# Patient Record
Sex: Female | Born: 1980 | Hispanic: Yes | Marital: Married | Smoking: Never smoker
Health system: Southern US, Community
[De-identification: ages and names within clinical notes are randomized; demographics above are authoritative.]

## PROBLEM LIST (undated history)

## (undated) DIAGNOSIS — Z641 Problems related to multiparity: Secondary | ICD-10-CM

## (undated) DIAGNOSIS — O24419 Gestational diabetes mellitus in pregnancy, unspecified control: Secondary | ICD-10-CM

## (undated) DIAGNOSIS — O09529 Supervision of elderly multigravida, unspecified trimester: Secondary | ICD-10-CM

## (undated) HISTORY — PX: CHOLECYSTECTOMY: SHX55

---

## 2004-10-07 ENCOUNTER — Emergency Department: Payer: Self-pay | Admitting: Emergency Medicine

## 2005-04-29 ENCOUNTER — Emergency Department: Payer: Self-pay | Admitting: Emergency Medicine

## 2007-12-20 ENCOUNTER — Observation Stay: Payer: Self-pay | Admitting: Obstetrics and Gynecology

## 2008-03-23 ENCOUNTER — Observation Stay: Payer: Self-pay | Admitting: Obstetrics and Gynecology

## 2008-04-06 ENCOUNTER — Inpatient Hospital Stay: Payer: Self-pay

## 2008-07-02 ENCOUNTER — Other Ambulatory Visit: Payer: Self-pay

## 2008-07-03 ENCOUNTER — Inpatient Hospital Stay: Payer: Self-pay | Admitting: *Deleted

## 2010-12-07 ENCOUNTER — Emergency Department: Payer: Self-pay | Admitting: Emergency Medicine

## 2011-10-09 ENCOUNTER — Emergency Department: Payer: Self-pay | Admitting: Internal Medicine

## 2011-10-14 LAB — PATHOLOGY REPORT

## 2011-11-19 HISTORY — PX: LEEP: SHX91

## 2012-01-14 ENCOUNTER — Emergency Department: Payer: Self-pay | Admitting: Emergency Medicine

## 2012-01-15 LAB — URINALYSIS, COMPLETE
Bacteria: NONE SEEN
Bilirubin,UR: NEGATIVE
Blood: NEGATIVE
Glucose,UR: NEGATIVE mg/dL (ref 0–75)
Ketone: NEGATIVE
Leukocyte Esterase: NEGATIVE
Ph: 5 (ref 4.5–8.0)
RBC,UR: <1 /HPF (ref 0–5)
Specific Gravity: 1.013 (ref 1.003–1.030)
Squamous Epithelial: <1
WBC UR: <1 /HPF (ref 0–5)

## 2012-01-15 LAB — CBC
MCH: 27 pg (ref 26.0–34.0)
MCHC: 33 g/dL (ref 32.0–36.0)
MCV: 82 fL (ref 80–100)
Platelet: 235 10*3/uL (ref 150–440)

## 2012-01-15 LAB — COMPREHENSIVE METABOLIC PANEL
Albumin: 3.7 g/dL (ref 3.4–5.0)
Alkaline Phosphatase: 75 U/L (ref 50–136)
BUN: 10 mg/dL (ref 7–18)
Bilirubin,Total: 0.3 mg/dL (ref 0.2–1.0)
Calcium, Total: 8.8 mg/dL (ref 8.5–10.1)
Chloride: 106 mmol/L (ref 98–107)
Co2: 21 mmol/L (ref 21–32)
Creatinine: 0.17 mg/dL — ABNORMAL LOW (ref 0.60–1.30)
EGFR (African American): >60
EGFR (Non-African Amer.): >60
SGOT(AST): 46 U/L — ABNORMAL HIGH (ref 15–37)
SGPT (ALT): 27 U/L

## 2012-01-15 LAB — LIPASE, BLOOD: Lipase: 94 U/L (ref 73–393)

## 2012-04-09 ENCOUNTER — Encounter: Payer: Self-pay | Admitting: Obstetrics and Gynecology

## 2012-04-23 ENCOUNTER — Encounter: Payer: Self-pay | Admitting: Obstetrics & Gynecology

## 2012-05-18 ENCOUNTER — Encounter: Payer: Self-pay | Admitting: Obstetrics and Gynecology

## 2012-06-01 ENCOUNTER — Encounter: Payer: Self-pay | Admitting: Maternal and Fetal Medicine

## 2012-06-15 ENCOUNTER — Encounter: Payer: Self-pay | Admitting: Obstetrics and Gynecology

## 2012-06-29 ENCOUNTER — Encounter: Payer: Self-pay | Admitting: Obstetrics and Gynecology

## 2012-09-02 ENCOUNTER — Observation Stay: Payer: Self-pay | Admitting: Obstetrics and Gynecology

## 2012-09-21 ENCOUNTER — Inpatient Hospital Stay: Payer: Self-pay | Admitting: Obstetrics and Gynecology

## 2012-09-21 LAB — CBC WITH DIFFERENTIAL/PLATELET
Basophil #: 0.1 10*3/uL (ref 0.0–0.1)
Eosinophil %: 0.6 %
Lymphocyte #: 1.9 10*3/uL (ref 1.0–3.6)
Lymphocyte %: 19.4 %
MCH: 21.9 pg — ABNORMAL LOW (ref 26.0–34.0)
MCV: 70 fL — ABNORMAL LOW (ref 80–100)
Monocyte #: 1 x10 3/mm — ABNORMAL HIGH (ref 0.2–0.9)
Neutrophil %: 69.2 %
Platelet: 227 10*3/uL (ref 150–440)
RBC: 3.98 10*6/uL (ref 3.80–5.20)
RDW: 17.8 % — ABNORMAL HIGH (ref 11.5–14.5)
WBC: 10 10*3/uL (ref 3.6–11.0)

## 2012-09-22 LAB — HEMOGLOBIN: HGB: 7.9 g/dL — ABNORMAL LOW (ref 12.0–16.0)

## 2013-05-24 ENCOUNTER — Ambulatory Visit: Payer: Self-pay | Admitting: Advanced Practice Midwife

## 2013-08-12 ENCOUNTER — Encounter: Payer: Self-pay | Admitting: Obstetrics and Gynecology

## 2013-10-08 ENCOUNTER — Ambulatory Visit: Payer: Self-pay | Admitting: Oncology

## 2013-10-08 LAB — IRON AND TIBC: Iron Saturation: 5 %

## 2013-10-08 LAB — CBC CANCER CENTER
Basophil #: 0 x10 3/mm (ref 0.0–0.1)
Eosinophil #: 0 x10 3/mm (ref 0.0–0.7)
Eosinophil %: 0.4 %
HCT: 31.1 % — ABNORMAL LOW (ref 35.0–47.0)
HGB: 9.5 g/dL — ABNORMAL LOW (ref 12.0–16.0)
MCV: 69 fL — ABNORMAL LOW (ref 80–100)
Monocyte #: 1 x10 3/mm — ABNORMAL HIGH (ref 0.2–0.9)
Monocyte %: 8.8 %
Neutrophil #: 7.6 x10 3/mm — ABNORMAL HIGH (ref 1.4–6.5)
Platelet: 296 x10 3/mm (ref 150–440)
RBC: 4.53 10*6/uL (ref 3.80–5.20)
RDW: 18.1 % — ABNORMAL HIGH (ref 11.5–14.5)
WBC: 10.9 x10 3/mm (ref 3.6–11.0)

## 2013-10-08 LAB — FERRITIN: Ferritin (ARMC): 3 ng/mL — ABNORMAL LOW (ref 8–388)

## 2013-10-11 ENCOUNTER — Inpatient Hospital Stay: Payer: Self-pay | Admitting: Obstetrics and Gynecology

## 2013-10-12 LAB — CBC WITH DIFFERENTIAL/PLATELET
Eosinophil #: 0 10*3/uL (ref 0.0–0.7)
HCT: 28.8 % — ABNORMAL LOW (ref 35.0–47.0)
Lymphocyte #: 2.3 10*3/uL (ref 1.0–3.6)
MCH: 20.7 pg — ABNORMAL LOW (ref 26.0–34.0)
MCHC: 30.5 g/dL — ABNORMAL LOW (ref 32.0–36.0)
Neutrophil %: 75.5 %
Platelet: 272 10*3/uL (ref 150–440)
RBC: 4.25 10*6/uL (ref 3.80–5.20)
RDW: 18 % — ABNORMAL HIGH (ref 11.5–14.5)
WBC: 13.9 10*3/uL — ABNORMAL HIGH (ref 3.6–11.0)

## 2013-10-13 LAB — HEMATOCRIT: HCT: 25.9 % — ABNORMAL LOW (ref 35.0–47.0)

## 2013-12-03 IMAGING — US US OB US >=[ID] SNGL FETUS
1 series · 13 of 28 positions shown · non-contrast
Comparison: none

REASON FOR EXAM: Dates Anatomy
COMMENTS:

[Series 1: us ob us >=(id) sngl fetus · 0.26mm/px · 13 of 68 slices shown]
[im 3/68]
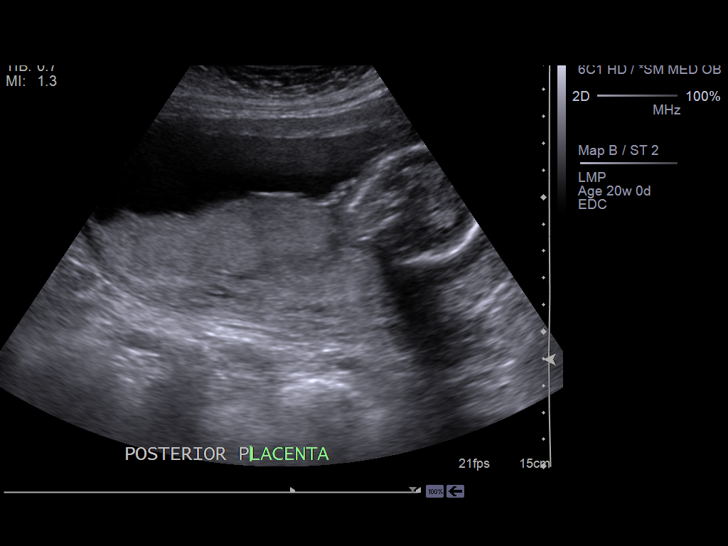
[im 8/68]
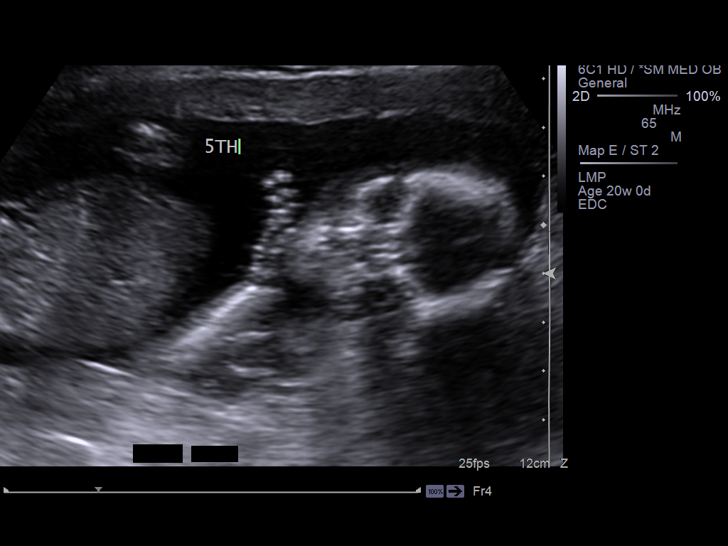
[im 13/68]
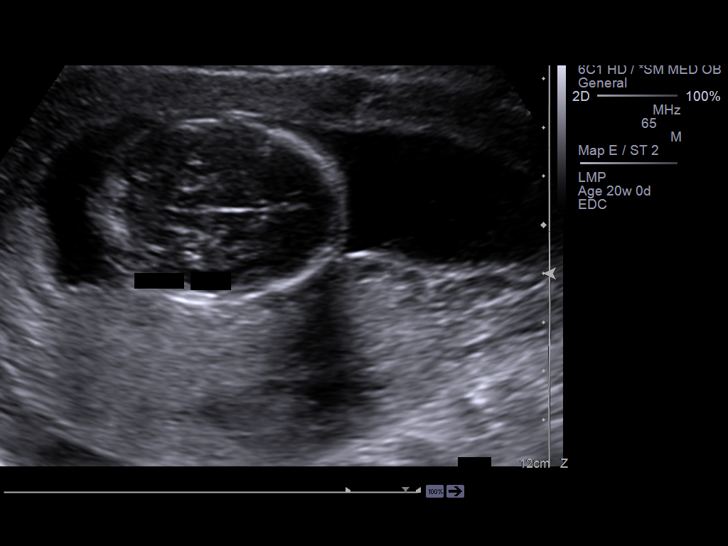
[im 18/68]
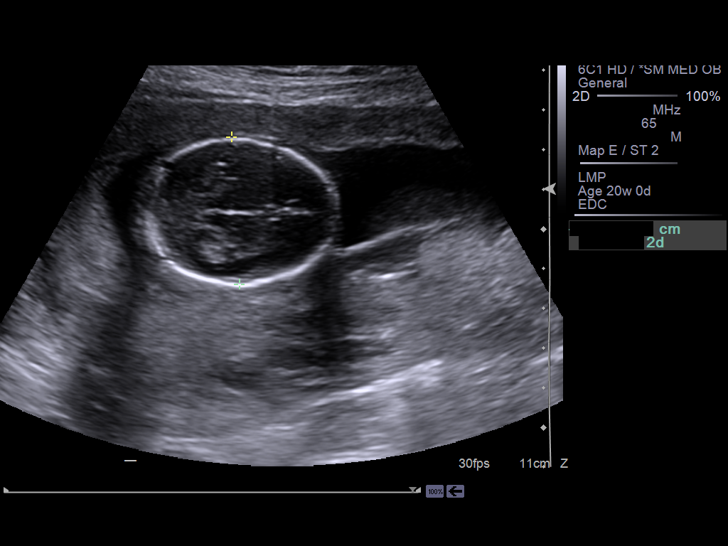
[im 23/68]
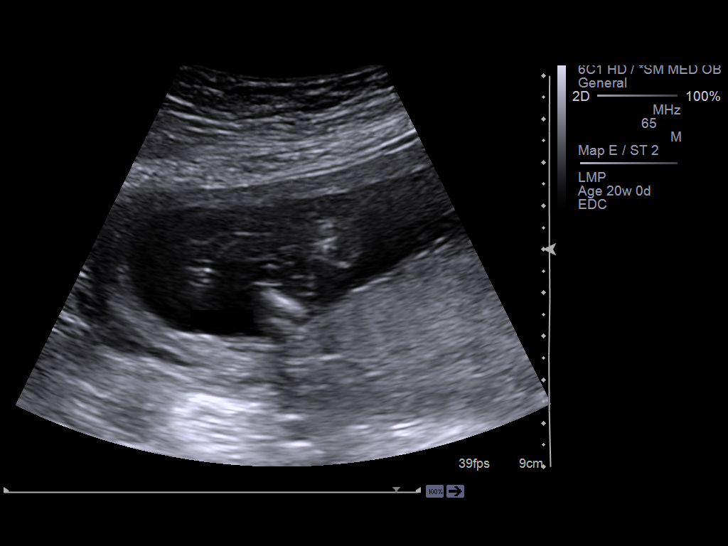
[im 28/68]
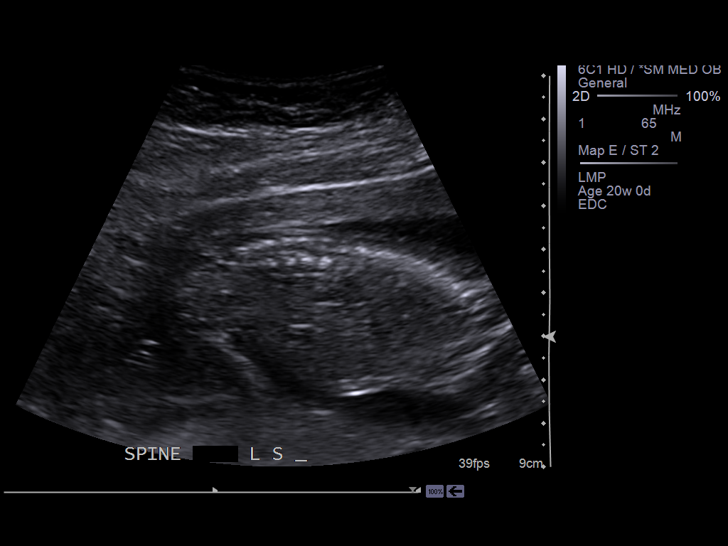
[im 35/68]
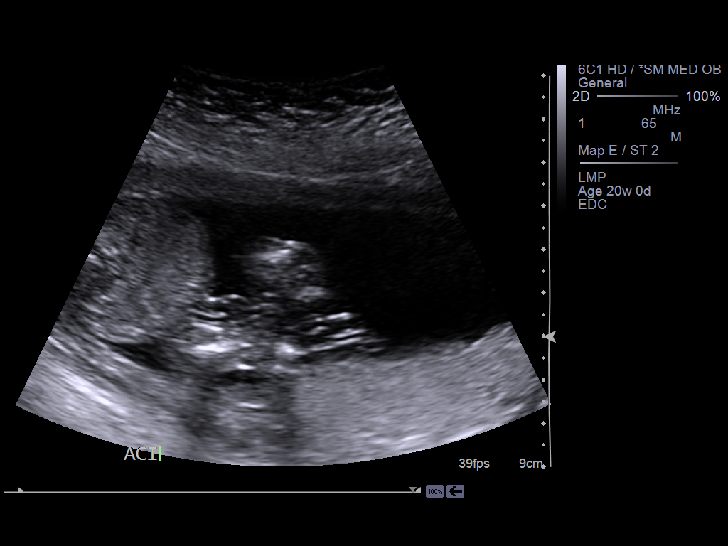
[im 40/68]
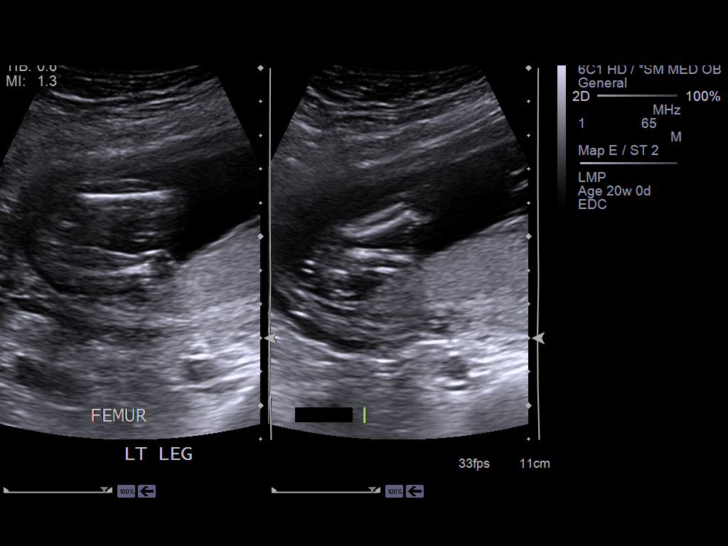
[im 45/68]
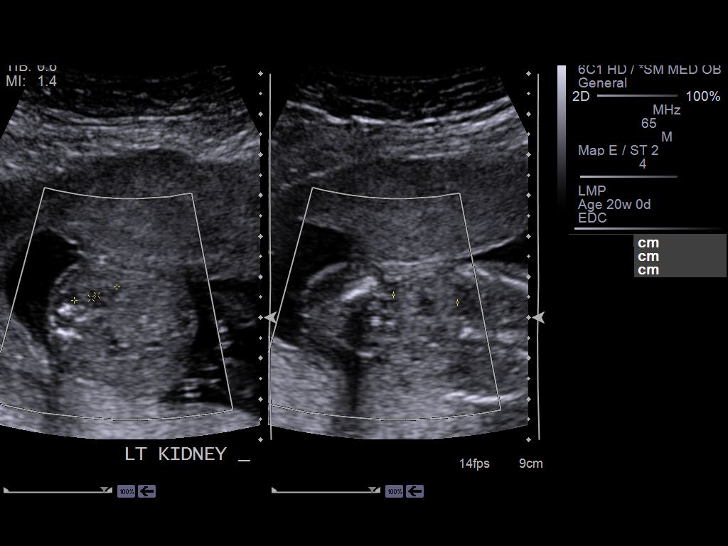
[im 50/68]
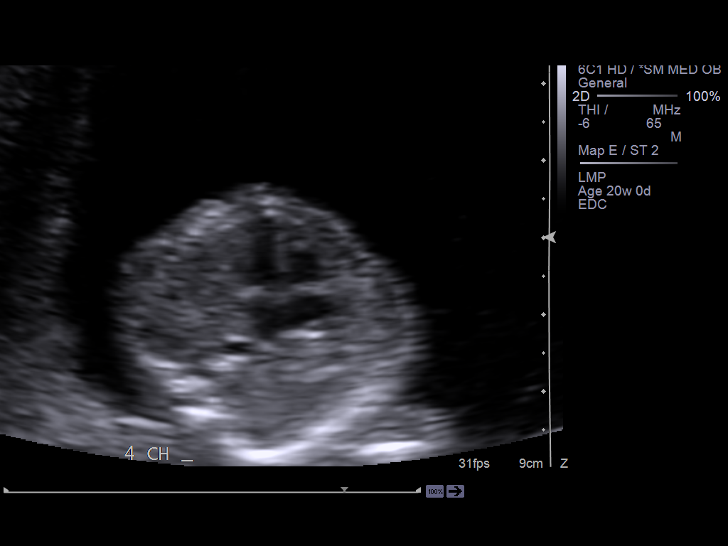
[im 55/68]
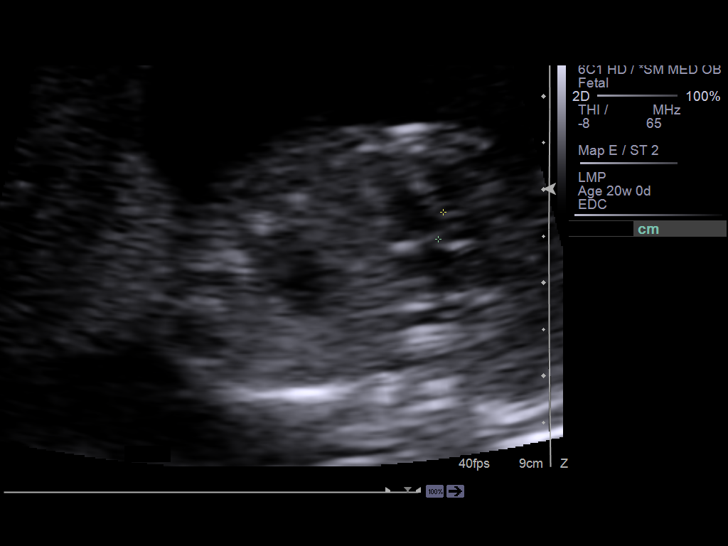
[im 60/68]
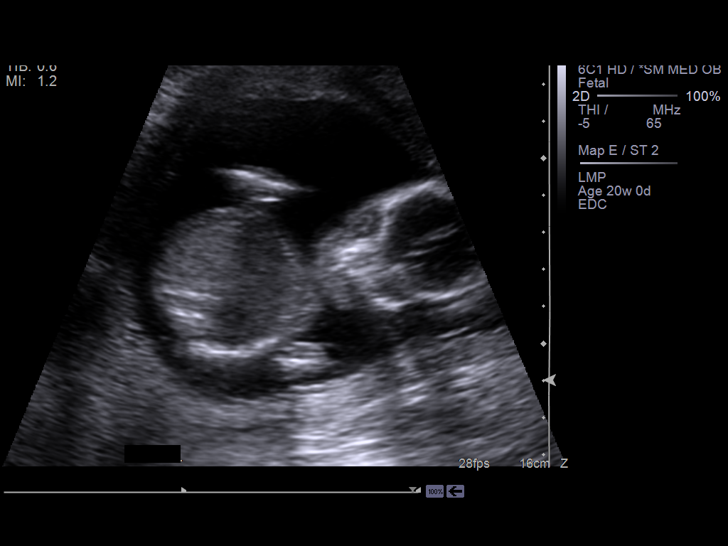
[im 65/68]
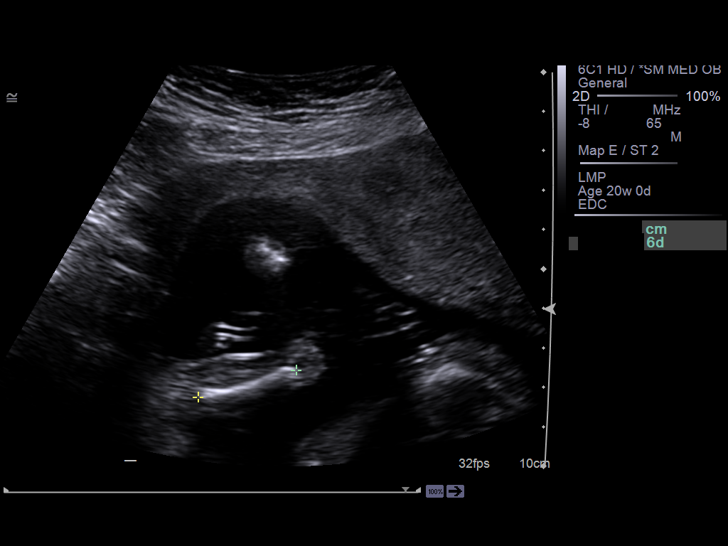

[13 of 28 positions shown; findings below may reference images not displayed]

PROCEDURE:     US  - US OB GREATER/OR EQUAL TO [id number]  - May 24, 2013 [DATE]

RESULT:      The cervix length is 5.30 cm. The cervix appears closed. The
placenta is fundal and posterior. The distance from the inferior margin of
the posterior aspect of the placenta to the internal cervical os is 4.9 cm.
The placenta is grade 0. There is a single intrauterine fetus with evidence
of trauma, extremity and diaphragmatic motion. Cardiac activity is seen with
a heart rate of 152 beats per minute. Umbilical and placental CORD insertion
appear normal. Fetal anatomy shows no gross abnormality. The amniotic fluid
volume is visually normal. There is a transverse lie during imaging. Two
umbilical arteries are seen within the umbilical cord. Based on the
biophysical measurements chain the gestational age is 17 weeks 4 days with
an ultrasound EDC of 10/28/2013.
IMPRESSION: Single intrauterine fetus consistent with 17 week 4 day
gestation with an ultrasound EDC of 10/28/2013.

[REDACTED]

Addendum: The sixth sentence of the report should read "there is a single
intrauterine fetus with evidence of trunk, extremity and diaphragmatic
motion." the word "trauma" was a voice recognition error.

## 2013-12-16 ENCOUNTER — Ambulatory Visit: Payer: Self-pay | Admitting: Oncology

## 2015-03-12 NOTE — Consult Note (Signed)
Referral Information:   Reason for Referral Pregnant with history of recent LEEP for abnormal PAP (01/22/2012)    Referring Physician [facility]    Prenatal Hx 34yo [id number] at 4047w6d by today's ultrasound presents with history of recent LEEP.  States urine PT was negative at the time of the procedure.  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  This was her first excisional procedure.  Prior 4 pregnancies were term and uncomplicated.    Past Obstetrical Hx 1997 458w3d female infant, 7#2oz, SVD, no pregnancy complications 1999 39w female infant, 7#, SVD, no pregnancy complications 2004 7570w6d female infant, 6#14oz, SVD, no pregnancy complications 2009 6750w2d female infant, 5#15oz, SVD, no pregnancy complications 2012 SAB (first trimester), requiring D&C 2013 current   Home Medications: Medication Instructions Status  prenatal 1 tab(s) orally once a day Active   Allergies:   No Known Allergies:   Vital Signs/Notes:  Nursing Vital Signs: **Vital Signs.:   23-May-13 08:39   Vital Signs Type Routine   Pulse Pulse 69   Pulse source per Dinamap   Respirations Respirations 12   Systolic BP Systolic BP 105   Diastolic BP (mmHg) Diastolic BP (mmHg) 56   Mean BP 72   BP Source Dinamap   Perinatal Consult:   PGyn Hx Hx of abnormal PAPs since 2010, no prior excisional procedures, [facility], I&D of Bartholin's cyst in 1997    PMed Hx Rubella Immune, Hx of varicella    Past Medical History cont'd Hx of kidney stones (no surgery) s/p Cholycystectomy    PSurg Hx D&C 2012, Cholycystectomy 2011    FHx 2 brothers with epilepsy (one after an accident and one after a scorpion bite, the latter of which died at age 36 and also had some kind of cancer); Father died after an accident; denies history of clots, MI, stroke    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 Not working    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    Soc Hx Married, FOB is same for all pregnancies; denies tobacco, ETOH, drug use    Review Of Systems:   Fever/Chills No    Cough No    Abdominal Pain No    Diarrhea No    Constipation No    Nausea/Vomiting No    SOB/DOE No    Chest Pain No    Dysuria No    Tolerating Diet Yes    Medications/Allergies Reviewed Medications/Allergies reviewed     Additional Lab/Radiology Notes Pathology of LEEP specimen 01/22/2012:  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   Impression/Recommendations:   Impression 34yo [id number] at 14 weeks 6 days based on today's ultrasound (states LMP was one week early) with recent LEEP procedure for abnormal PAP.  Prior pregnancies all at term with no complications.  There is limited and controversial data in the literature with some reports suggesting LEEP or cone does increase a patient's risk for PPROM and preterm labor/delivery and others suggesting that other factors such as depth of incision, prior history, and interval to conception as well as number of procedures may have more of an effect.  Given that the patient's LEEP was only 2 1/2 months prior to today, there is a theoretical risk that she may have  some incomplete healing which could compromise her cervical competence.  We discussed that there is currently no recommendation for prophylactic cerclage  and limited data to recommend surveillance but that given the close interval between the procedure and conception (<3 months), sonographic cervical length screening would be reasonable and non-invasive.  We briefly spoke about treatment options should a short cervix be diagnosed.    Recommendations 1.  Serial cervical length ultrasounds, every 2 weeks (if normal) until 24 weeks.  Will schedule  follow up ultrasound in 2 weeks for cervical length only and perform ultrasound for anatomy and cervical length in 4 weeks. 2.  Offer second trimester screening given that the patient is too far along for first trimester screening. 3.  Review preterm labor signs/symptoms. 4.  Discuss birth control options as patient does not desire future pregnancies. 5.  Keep scheduled follow up at [facility] in September for abnormal PAP surveillance.     Total Time Spent with Patient 30 minutes    >50% of visit spent in couseling/coordination of care yes    Office Use Only 99242  Level 2 ( ) NEW office consult exp prob focused   Coding Description: MATERNAL CONDITIONS/HISTORY INDICATION(S).   OTHER: Abnormal PAP with recent LEEP.  Electronic Signatures: Kirby Funk (MD)  (Signed 23-May-13 10:01)  Authored: Referral, Home Medications, Allergies, Vital Signs/Notes, Consult, Exam, Lab/Radiology Notes, Impression, Billing, Coding Description   Last Updated: 23-May-13 10:01 by Kirby Funk (MD)

## 2015-03-28 NOTE — H&P (Signed)
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 Pt arrived and making active progress and prepared for a precip delivery.   Presents with contractions   Patient's Medical History Anemia, Overweight,   Patient's Surgical History LEEP   Medications Pre Natal Vitamins   Allergies NKDA   Social History none   Family History Non-Contributory   ROS:  ROS All systems were reviewed.  HEENT, CNS, GI, GU, Respiratory, CV, Renal and Musculoskeletal systems were found to be normal.   Exam:  Vital Signs stable   General no apparent distress   Mental Status clear   Chest clear   Heart normal sinus rhythm, no murmur/gallop/rubs   Abdomen gravid, non-tender   Estimated Fetal Weight Average for gestational age   Fetal Position vtx   Back no CVAT   Edema 1+   Reflexes 1+   Clonus negative   Pelvic 6/100/vtx0   Mebranes Intact, SROM at delivery clear   Description clear   FHT normal rate with no decels   Impression:  Impression active labor   Plan:  Plan monitor contractions and for cervical change   Comments Prepared for delivery.   Electronic Signatures: Sharee PimpleJones, Halla Chopp W (CNM)  (Signed [phone]-Nov-14 01:14)  Authored: L&D Evaluation   Last Updated: 25-Nov-14 01:14 by Sharee PimpleJones, Jose Corvin W (CNM)

## 2018-11-13 ENCOUNTER — Encounter: Payer: Self-pay | Admitting: Emergency Medicine

## 2018-11-13 ENCOUNTER — Other Ambulatory Visit: Payer: Self-pay

## 2018-11-13 ENCOUNTER — Emergency Department
Admission: EM | Admit: 2018-11-13 | Discharge: 2018-11-13 | Disposition: A | Discharge status: Home / Self Care | Payer: Self-pay | Attending: Emergency Medicine | Admitting: Emergency Medicine

## 2018-11-13 DIAGNOSIS — Z3490 Encounter for supervision of normal pregnancy, unspecified, unspecified trimester: Secondary | ICD-10-CM | POA: Insufficient documentation

## 2018-11-13 DIAGNOSIS — K2970 Gastritis, unspecified, without bleeding: Secondary | ICD-10-CM | POA: Insufficient documentation

## 2018-11-13 LAB — URINALYSIS, COMPLETE (UACMP) WITH MICROSCOPIC
Bilirubin Urine: NEGATIVE
Glucose, UA: NEGATIVE mg/dL
Hgb urine dipstick: NEGATIVE
KETONES UR: 20 mg/dL — AB
Leukocytes, UA: NEGATIVE
Nitrite: NEGATIVE
PH: 6 (ref 5.0–8.0)
Protein, ur: NEGATIVE mg/dL
Specific Gravity, Urine: 1.014 (ref 1.005–1.030)

## 2018-11-13 LAB — COMPREHENSIVE METABOLIC PANEL
ALT: 39 U/L (ref 0–44)
AST: 48 U/L — ABNORMAL HIGH (ref 15–41)
Albumin: 4.2 g/dL (ref 3.5–5.0)
Alkaline Phosphatase: 95 U/L (ref 38–126)
Anion gap: 10 (ref 5–15)
BUN: 7 mg/dL (ref 6–20)
CHLORIDE: 101 mmol/L (ref 98–111)
CO2: 23 mmol/L (ref 22–32)
Calcium: 9.1 mg/dL (ref 8.9–10.3)
Creatinine, Ser: 0.54 mg/dL (ref 0.44–1.00)
GFR calc Af Amer: >60 mL/min (ref >60)
GFR calc non Af Amer: >60 mL/min (ref >60)
Glucose, Bld: 113 mg/dL — ABNORMAL HIGH (ref 70–99)
Potassium: 3.4 mmol/L — ABNORMAL LOW (ref 3.5–5.1)
Sodium: 134 mmol/L — ABNORMAL LOW (ref 135–145)
Total Bilirubin: 0.4 mg/dL (ref 0.3–1.2)
Total Protein: 8 g/dL (ref 6.5–8.1)

## 2018-11-13 LAB — CBC
HCT: 39.6 % (ref 36.0–46.0)
Hemoglobin: 12.3 g/dL (ref 12.0–15.0)
MCH: 26.1 pg (ref 26.0–34.0)
MCHC: 31.1 g/dL (ref 30.0–36.0)
MCV: 84.1 fL (ref 80.0–100.0)
NRBC: 0 % (ref 0.0–0.2)
Platelets: 269 10*3/uL (ref 150–400)
RBC: 4.71 MIL/uL (ref 3.87–5.11)
RDW: 14.8 % (ref 11.5–15.5)
WBC: 9.2 10*3/uL (ref 4.0–10.5)

## 2018-11-13 LAB — LIPASE, BLOOD: Lipase: 29 U/L (ref 11–51)

## 2018-11-13 LAB — POCT PREGNANCY, URINE: Preg Test, Ur: POSITIVE — AB

## 2018-11-13 LAB — HCG, QUANTITATIVE, PREGNANCY: hCG, Beta Chain, Quant, S: 91549 m[IU]/mL — ABNORMAL HIGH (ref <5)

## 2018-11-13 MED ORDER — FAMOTIDINE 20 MG PO TABS: 20.0000 mg | ORAL_TABLET | Freq: Every day | ORAL | 1 refills | Status: DC

## 2018-11-13 MED ORDER — SUCRALFATE 1 G PO TABS: 1.0000 g | ORAL_TABLET | Freq: Four times a day (QID) | ORAL | 0 refills | Status: DC

## 2018-11-13 MED ORDER — LIDOCAINE VISCOUS HCL 2 % MT SOLN
15.0000 mL | Freq: Once | OROMUCOSAL | Status: AC
  Administered 2018-11-13: 15 mL via ORAL
  Filled 2018-11-13: qty 15

## 2018-11-13 MED ORDER — ALUM & MAG HYDROXIDE-SIMETH 200-200-20 MG/5ML PO SUSP
30.0000 mL | Freq: Once | ORAL | Status: AC
  Administered 2018-11-13: 30 mL via ORAL
  Filled 2018-11-13 (×2): qty 30

## 2018-11-13 NOTE — ED Triage Notes (Signed)
Pt in with co mid abd pain that started a month ago, states it is intermittent but now more frequent. Has not taken any otc meds at home for the same, denies any n,v.d. States worse after she eats.

## 2018-11-13 NOTE — ED Provider Notes (Signed)
[facility] Emergency Department Provider Note  ________________________________________   I have reviewed the triage vital signs and the nursing notes.   HISTORY  Chief Complaint Abdominal Pain   History limited by: Not Limited   HPI Kristin Franklin is a 37 y.o. female who presents to the emergency department today because of concern for abdominal pain. The pain is located in the upper abdomen. The patient states that the pain started roughly 1 month ago but it has been constant for the past few days. It is somewhat worse when she tries to eat. She denies any vomiting with the pain. No change in defecation. She denies any fevers. The patient states that the pain reminds her of when she had gallbladder disease however had her gallbladder removed a number of years ago.   No past medical history on file.  There are no active problems to display for this patient.  Prior to Admission medications   Not on File    Allergies Patient has no allergy information on record.  No family history on file.  Social History Social History   Tobacco Use  . Smoking status: Not on file  Substance Use Topics  . Alcohol use: Not on file  . Drug use: Not on file    Review of Systems Constitutional: No fever/chills Eyes: No visual changes. ENT: No sore throat. Cardiovascular: Denies chest pain. Respiratory: Denies shortness of breath. Gastrointestinal: Positive for upper epigastric abdominal pain. Genitourinary: Negative for dysuria. Musculoskeletal: Negative for back pain. Skin: Negative for rash. Neurological: Negative for headaches, focal weakness or numbness.  ____________________________________________   PHYSICAL EXAM:  VITAL SIGNS: ED Triage Vitals [11/13/18 2036]  Enc Vitals Group     BP 139/86     Pulse Rate 86     Resp 20     Temp 98.4 F (36.9 C)     Temp Source Oral     SpO2 100 %     Weight 170 lb (77.1 kg)     Height    Head Circumference      Peak Flow      Pain Score 10   Constitutional: Alert and oriented.  Eyes: Conjunctivae are normal.  ENT      Head: Normocephalic and atraumatic.      Nose: No congestion/rhinnorhea.      Mouth/Throat: Mucous membranes are moist.      Neck: No stridor. Hematological/Lymphatic/Immunilogical: No cervical lymphadenopathy. Cardiovascular: Normal rate, regular rhythm.  No murmurs, rubs, or gallops.  Respiratory: Normal respiratory effort without tachypnea nor retractions. Breath sounds are clear and equal bilaterally. No wheezes/rales/rhonchi. Gastrointestinal: Soft and non tender. No rebound. No guarding.  Genitourinary: Deferred Musculoskeletal: Normal range of motion in all extremities. No lower extremity edema. Neurologic:  Normal speech and language. No gross focal neurologic deficits are appreciated.  Skin:  Skin is warm, dry and intact. No rash noted. Psychiatric: Mood and affect are normal. Speech and behavior are normal. Patient exhibits appropriate insight and judgment.  ____________________________________________    LABS (pertinent positives/negatives)  Upreg positive CBC wbc 9.2, hgb 12.3, plt 269 CMP na 134, k 3.4, glu 113, cr 0.54 Lipase 29  ____________________________________________   EKG  None  ____________________________________________    RADIOLOGY  None  ____________________________________________   PROCEDURES  Procedures  ____________________________________________   INITIAL IMPRESSION / ASSESSMENT AND PLAN / ED COURSE  Pertinent labs & imaging results that were available during my care of the patient were reviewed by me and considered  in my medical decision making (see chart for details).   Patient presented to the emergency department today because of concern for upper abdominal pain.  The patient had some tenderness in the epigastric region.  Patient is status post cholecystectomy.  Terms of the upper abdominal  pain no concerning leukocytosis, elevated lipase or elevated LFTs.  She did feel better after GI cocktail.  The patient was also found to be pregnant.  At this point I think pain is likely related secondary to acid reflux.  Did discuss positive pregnancy test with patient.   ____________________________________________   FINAL CLINICAL IMPRESSION(S) / ED DIAGNOSES  Final diagnoses:  Gastritis, presence of bleeding unspecified, unspecified chronicity, unspecified gastritis type  Pregnancy, unspecified gestational age     Note: This dictation was prepared with Nurse, children'sDragon dictation. Any transcriptional errors that result from this process are unintentional     Phineas SemenGoodman, Mykala Mccready, MD 11/14/18 [phone]

## 2018-11-13 NOTE — Discharge Instructions (Addendum)
Please start taking prenatal vitamins. Please follow up with ob/gyn.

## 2018-11-18 NOTE — L&D Delivery Note (Signed)
Obstetrical Delivery Note   Date of Delivery:   06/11/2019 Primary OB:   [facility] Gestational Age/EDD: [redacted]w[redacted]d (Dated by 12wk6d [place]) Antepartum complications: GDM vs W1UU, unmonitored; AMA; little prenatal care, anemia  Delivered By:   [name], CNM  Delivery Type:   spontaneous vaginal delivery  Procedure Details:   Mother with urge to push with anterior lip. Able to reduce lip with maternal pushing. Mother pushed to deliver a vigorous female infant in LOA. Good cry. Baby suctioned and dried and placed on mother's abdomen. Spontaneous delivery of intact placenta and 3 vessel cord. Cytotec 800 mcg and IV Pitocin administered prophyllactically due to grand multip status. Small first degree perineal laceration repaired under local with 3-0 Chromic. No vagina lacerations. Unable to visualize cervix.  Anesthesia:    local Intrapartum complications: None GBS:    negative Laceration:    1st degree perineal Episiotomy:    none Placenta:    Via active 3rd stage. To pathology: no Estimated Blood Loss:  300 ml Baby:    Liveborn female, Apgars 8/9, weight pending    [name], CNM

## 2018-12-01 LAB — OB RESULTS CONSOLE HIV ANTIBODY (ROUTINE TESTING): HIV: NONREACTIVE

## 2018-12-02 ENCOUNTER — Other Ambulatory Visit: Payer: Self-pay | Admitting: Nurse Practitioner

## 2018-12-02 DIAGNOSIS — Z3481 Encounter for supervision of other normal pregnancy, first trimester: Secondary | ICD-10-CM

## 2018-12-02 LAB — OB RESULTS CONSOLE HEPATITIS B SURFACE ANTIGEN: Hepatitis B Surface Ag: NEGATIVE

## 2018-12-02 LAB — OB RESULTS CONSOLE RPR: RPR: NONREACTIVE

## 2018-12-04 ENCOUNTER — Ambulatory Visit
Admission: RE | Admit: 2018-12-04 | Discharge: 2018-12-04 | Disposition: A | Discharge status: Home / Self Care | Payer: Self-pay | Source: Ambulatory Visit | Attending: Nurse Practitioner | Admitting: Nurse Practitioner

## 2018-12-04 ENCOUNTER — Other Ambulatory Visit: Payer: Self-pay | Admitting: Nurse Practitioner

## 2018-12-04 DIAGNOSIS — Z3481 Encounter for supervision of other normal pregnancy, first trimester: Secondary | ICD-10-CM

## 2018-12-04 LAB — OB RESULTS CONSOLE GC/CHLAMYDIA
Chlamydia: NEGATIVE
Gonorrhea: NEGATIVE

## 2018-12-04 LAB — OB RESULTS CONSOLE VARICELLA ZOSTER ANTIBODY, IGG: Varicella: IMMUNE

## 2018-12-04 LAB — OB RESULTS CONSOLE RUBELLA ANTIBODY, IGM: Rubella: IMMUNE

## 2019-06-11 ENCOUNTER — Inpatient Hospital Stay
Admission: EM | Admit: 2019-06-11 | Discharge: 2019-06-12 | DRG: 807 | Disposition: A | Discharge status: Home / Self Care | Payer: Medicaid Other | Attending: Certified Nurse Midwife | Admitting: Certified Nurse Midwife

## 2019-06-11 ENCOUNTER — Other Ambulatory Visit: Payer: Self-pay

## 2019-06-11 DIAGNOSIS — Z1159 Encounter for screening for other viral diseases: Secondary | ICD-10-CM | POA: Diagnosis not present

## 2019-06-11 DIAGNOSIS — O09529 Supervision of elderly multigravida, unspecified trimester: Secondary | ICD-10-CM

## 2019-06-11 DIAGNOSIS — O9902 Anemia complicating childbirth: Secondary | ICD-10-CM | POA: Diagnosis present

## 2019-06-11 DIAGNOSIS — Z3A39 39 weeks gestation of pregnancy: Secondary | ICD-10-CM

## 2019-06-11 DIAGNOSIS — O26893 Other specified pregnancy related conditions, third trimester: Secondary | ICD-10-CM | POA: Diagnosis present

## 2019-06-11 DIAGNOSIS — D649 Anemia, unspecified: Secondary | ICD-10-CM | POA: Diagnosis present

## 2019-06-11 DIAGNOSIS — O24429 Gestational diabetes mellitus in childbirth, unspecified control: Secondary | ICD-10-CM | POA: Diagnosis present

## 2019-06-11 DIAGNOSIS — Z789 Other specified health status: Secondary | ICD-10-CM | POA: Diagnosis present

## 2019-06-11 DIAGNOSIS — Z641 Problems related to multiparity: Secondary | ICD-10-CM

## 2019-06-11 HISTORY — DX: Supervision of elderly multigravida, unspecified trimester: O09.529

## 2019-06-11 HISTORY — DX: Problems related to multiparity: Z64.1

## 2019-06-11 HISTORY — DX: Gestational diabetes mellitus in pregnancy, unspecified control: O24.419

## 2019-06-11 LAB — COMPREHENSIVE METABOLIC PANEL
ALT: 10 U/L (ref 0–44)
AST: 17 U/L (ref 15–41)
Albumin: 3.1 g/dL — ABNORMAL LOW (ref 3.5–5.0)
Alkaline Phosphatase: 226 U/L — ABNORMAL HIGH (ref 38–126)
Anion gap: 9 (ref 5–15)
BUN: <5 mg/dL — ABNORMAL LOW (ref 6–20)
CO2: 19 mmol/L — ABNORMAL LOW (ref 22–32)
Calcium: 8.6 mg/dL — ABNORMAL LOW (ref 8.9–10.3)
Chloride: 108 mmol/L (ref 98–111)
Creatinine, Ser: <0.3 mg/dL — ABNORMAL LOW (ref 0.44–1.00)
Glucose, Bld: 75 mg/dL (ref 70–99)
Potassium: 3.3 mmol/L — ABNORMAL LOW (ref 3.5–5.1)
Sodium: 136 mmol/L (ref 135–145)
Total Bilirubin: 0.8 mg/dL (ref 0.3–1.2)
Total Protein: 6.4 g/dL — ABNORMAL LOW (ref 6.5–8.1)

## 2019-06-11 LAB — CBC
HCT: 29.6 % — ABNORMAL LOW (ref 36.0–46.0)
Hemoglobin: 8.6 g/dL — ABNORMAL LOW (ref 12.0–15.0)
MCH: 20.6 pg — ABNORMAL LOW (ref 26.0–34.0)
MCHC: 29.1 g/dL — ABNORMAL LOW (ref 30.0–36.0)
MCV: 71 fL — ABNORMAL LOW (ref 80.0–100.0)
Platelets: 233 10*3/uL (ref 150–400)
RBC: 4.17 MIL/uL (ref 3.87–5.11)
RDW: 19.3 % — ABNORMAL HIGH (ref 11.5–15.5)
WBC: 11.6 10*3/uL — ABNORMAL HIGH (ref 4.0–10.5)
nRBC: 0.3 % — ABNORMAL HIGH (ref 0.0–0.2)

## 2019-06-11 LAB — TYPE AND SCREEN
ABO/RH(D): O POS
Antibody Screen: NEGATIVE

## 2019-06-11 LAB — GLUCOSE, CAPILLARY
Glucose-Capillary: 67 mg/dL — ABNORMAL LOW (ref 70–99)
Glucose-Capillary: 142 mg/dL — ABNORMAL HIGH (ref 70–99)
Glucose-Capillary: 145 mg/dL — ABNORMAL HIGH (ref 70–99)

## 2019-06-11 LAB — SARS CORONAVIRUS 2 BY RT PCR (HOSPITAL ORDER, PERFORMED IN ~~LOC~~ HOSPITAL LAB): SARS Coronavirus 2: NEGATIVE

## 2019-06-11 LAB — GROUP B STREP BY PCR: Group B strep by PCR: NEGATIVE

## 2019-06-11 MED ORDER — FERROUS SULFATE 325 (65 FE) MG PO TABS
325.0000 mg | ORAL_TABLET | Freq: Two times a day (BID) | ORAL | Status: DC
  Administered 2019-06-11 – 2019-06-12 (×2): 325 mg via ORAL
  Filled 2019-06-11 (×2): qty 1

## 2019-06-11 MED ORDER — AMMONIA AROMATIC IN INHA
RESPIRATORY_TRACT | Status: AC
  Filled 2019-06-11: qty 10

## 2019-06-11 MED ORDER — ONDANSETRON HCL 4 MG PO TABS: 4.0000 mg | ORAL_TABLET | ORAL | Status: DC | PRN

## 2019-06-11 MED ORDER — LACTATED RINGERS IV SOLN: 500.0000 mL | INTRAVENOUS | Status: DC | PRN

## 2019-06-11 MED ORDER — MISOPROSTOL 200 MCG PO TABS
800.0000 ug | ORAL_TABLET | Freq: Once | ORAL | Status: AC | PRN
  Administered 2019-06-11: 800 ug via RECTAL

## 2019-06-11 MED ORDER — OXYTOCIN BOLUS FROM INFUSION
500.0000 mL | Freq: Once | INTRAVENOUS | Status: AC
  Administered 2019-06-11: 500 mL via INTRAVENOUS

## 2019-06-11 MED ORDER — COCONUT OIL OIL: 1.0000 "application " | TOPICAL_OIL | Status: DC | PRN

## 2019-06-11 MED ORDER — WITCH HAZEL-GLYCERIN EX PADS: 1.0000 "application " | MEDICATED_PAD | CUTANEOUS | Status: DC | PRN

## 2019-06-11 MED ORDER — SENNOSIDES-DOCUSATE SODIUM 8.6-50 MG PO TABS
2.0000 | ORAL_TABLET | ORAL | Status: DC
  Administered 2019-06-12: 2 via ORAL
  Filled 2019-06-11: qty 2

## 2019-06-11 MED ORDER — IBUPROFEN 600 MG PO TABS
600.0000 mg | ORAL_TABLET | Freq: Four times a day (QID) | ORAL | Status: DC
  Administered 2019-06-11 – 2019-06-12 (×3): 600 mg via ORAL
  Filled 2019-06-11 (×2): qty 1

## 2019-06-11 MED ORDER — SIMETHICONE 80 MG PO CHEW: 80.0000 mg | CHEWABLE_TABLET | ORAL | Status: DC | PRN

## 2019-06-11 MED ORDER — ACETAMINOPHEN 325 MG PO TABS
ORAL_TABLET | ORAL | Status: AC
  Filled 2019-06-11: qty 2

## 2019-06-11 MED ORDER — ONDANSETRON HCL 4 MG/2ML IJ SOLN: 4.0000 mg | INTRAMUSCULAR | Status: DC | PRN

## 2019-06-11 MED ORDER — LIDOCAINE HCL (PF) 1 % IJ SOLN
INTRAMUSCULAR | Status: AC
  Administered 2019-06-11: 30 mL via SUBCUTANEOUS
  Filled 2019-06-11: qty 30

## 2019-06-11 MED ORDER — OXYTOCIN 40 UNITS IN NORMAL SALINE INFUSION - SIMPLE MED
2.5000 [IU]/h | INTRAVENOUS | Status: DC
  Filled 2019-06-11: qty 1000

## 2019-06-11 MED ORDER — ACETAMINOPHEN 325 MG PO TABS
650.0000 mg | ORAL_TABLET | ORAL | Status: DC | PRN
  Administered 2019-06-11: 650 mg via ORAL

## 2019-06-11 MED ORDER — OXYCODONE HCL 5 MG PO TABS: 5.0000 mg | ORAL_TABLET | ORAL | Status: DC | PRN

## 2019-06-11 MED ORDER — IBUPROFEN 600 MG PO TABS
ORAL_TABLET | ORAL | Status: AC
  Filled 2019-06-11: qty 1

## 2019-06-11 MED ORDER — LIDOCAINE HCL (PF) 1 % IJ SOLN
30.0000 mL | INTRAMUSCULAR | Status: AC | PRN
  Administered 2019-06-11: 30 mL via SUBCUTANEOUS

## 2019-06-11 MED ORDER — OXYTOCIN 10 UNIT/ML IJ SOLN
INTRAMUSCULAR | Status: AC
  Filled 2019-06-11: qty 2

## 2019-06-11 MED ORDER — MISOPROSTOL 200 MCG PO TABS
ORAL_TABLET | ORAL | Status: AC
  Administered 2019-06-11: 800 ug via RECTAL
  Filled 2019-06-11: qty 4

## 2019-06-11 MED ORDER — AMMONIA AROMATIC IN INHA: 0.3000 mL | Freq: Once | RESPIRATORY_TRACT | Status: DC | PRN

## 2019-06-11 MED ORDER — BUTORPHANOL TARTRATE 2 MG/ML IJ SOLN: 1.0000 mg | INTRAMUSCULAR | Status: DC | PRN

## 2019-06-11 MED ORDER — LACTATED RINGERS IV SOLN
INTRAVENOUS | Status: DC
  Administered 2019-06-11: 11:00:00 via INTRAVENOUS

## 2019-06-11 MED ORDER — PRENATAL MULTIVITAMIN CH
1.0000 | ORAL_TABLET | Freq: Every day | ORAL | Status: DC
  Administered 2019-06-11: 1 via ORAL

## 2019-06-11 MED ORDER — DIBUCAINE (PERIANAL) 1 % EX OINT: 1.0000 "application " | TOPICAL_OINTMENT | CUTANEOUS | Status: DC | PRN

## 2019-06-11 MED ORDER — PRENATAL MULTIVITAMIN CH
ORAL_TABLET | ORAL | Status: AC
  Filled 2019-06-11: qty 1

## 2019-06-11 MED ORDER — BENZOCAINE-MENTHOL 20-0.5 % EX AERO: 1.0000 "application " | INHALATION_SPRAY | CUTANEOUS | Status: DC | PRN

## 2019-06-11 NOTE — Discharge Summary (Signed)
Physician Obstetric Discharge Summary  Patient ID: Kristin Franklin MRN: [id number] DOB/AGE: [birth year] 38 y.o.   Date of Admission: 06/11/2019 Date of Delivery: 06/11/2019 Delivering Provider: Hoyt Koch, MD Date of Discharge: 06/12/2019  Admitting Diagnosis: Onset of Labor at [redacted]w[redacted]d  Secondary Diagnosis: T2DM vs GDM, Insufficient prenatal care, AMA, Anemia  Mode of Delivery: normal spontaneous vaginal delivery 06/11/2019      Discharge Diagnosis: Term intrauterine pregnancy-delivered   Intrapartum Procedures: Atificial rupture of membranes   Post partum procedures: none  Complications: none   Brief Hospital Course  Kristin Franklin is a [id number] who had a SVD on 06/11/2019;  for further details of this delivery, please refer to the delivery note.  Patient had an uncomplicated postpartum course.  By time of discharge on PPD#1, her pain was controlled on oral pain medications; she had appropriate lochia and was ambulating, voiding without difficulty and tolerating regular diet.  She was deemed stable for discharge to home.    Labs: CBC Latest Ref Rng & Units 06/12/2019 06/11/2019 11/13/2018  WBC 4.0 - 10.5 K/uL 13.5(H) 11.6(H) 9.2  Hemoglobin 12.0 - 15.0 g/dL 7.9(L) 8.6(L) 12.3  Hematocrit 36.0 - 46.0 % 27.2(L) 29.6(L) 39.6  Platelets 150 - 400 K/uL 228 233 269   O POS  Physical exam:  Blood pressure (!) 92/55, pulse (!) 54, temperature 98.2 F (36.8 C), temperature source Oral, resp. rate 18, height 5\' 3"  (1.6 m), weight 76.2 kg, last menstrual period 11/06/2018, SpO2 97 %, unknown if currently breastfeeding. General: alert and no distress Lochia: appropriate Abdomen: soft, NT Uterine Fundus: firm Extremities: No evidence of DVT seen on physical exam. No lower extremity edema.  Discharge Instructions: Per After Visit Summary. Activity: Advance as tolerated. Pelvic rest for 6 weeks.  Also refer to Discharge Instructions Diet:  Regular Medications: Allergies as of 06/12/2019   No Known Allergies     Medication List    TAKE these medications   prenatal multivitamin Tabs tablet Take 1 tablet by mouth daily at 12 noon.     ALSO take Iron daily for low blood count   Outpatient follow up:  Follow-up Information    [name], CNM. Schedule an appointment as soon as possible for a visit in 4 week(s).   Specialty: Certified Nurse Midwife Why: For post partum check and Nexplanonimplant placement and blood sugar re-check Contact information: [place] [address] [phone]          Postpartum contraception: Plans Nexplanon  Discharged Condition: good  Discharged to: home   Newborn Data: female infant Disposition:home with mother  Apgars: APGAR (1 MIN): 8   APGAR (5 MINS): 9   APGAR (10 MINS):    Baby Feeding: Bottle  Hoyt Koch, MD 06/12/2019 8:43 AM

## 2019-06-11 NOTE — H&P (Signed)
OB History & Physical   History of Present Illness:  Chief Complaint:  Onset contractions at 0700, brown discharge, urge to push. HPI:  Kristin Franklin is a 38 y.o. [phone] female with EDC=06/12/2019 39wk6d dated by a 12wk6day ultrasound.  Her pregnancy has been complicated by AMA, W9QP vs early GDM-with unknown control, and little prenatal care (has not been seen since her anatomy scan at 18-19 weeks. .  She presents to L&D for evaluation of labor. Onset of regular contractions since 0700. No leakage of fluid. Brown show. No frank bleeding.      Maternal Medical History:   Past Medical History:  Diagnosis Date  . Advanced maternal age in multigravida   . Gestational diabetes    vs T2DM  . [place] multiparity     Past Surgical History:  Procedure Laterality Date  . CHOLECYSTECTOMY    . LEEP  2013    No Known Allergies  Prior to Admission medications   Medication Sig Start Date End Date Taking? Authorizing Provider  Prenatal Vit-Fe Fumarate-FA (PRENATAL MULTIVITAMIN) TABS tablet Take 1 tablet by mouth daily at 12 noon.   Yes [provider]  sucralfate (CARAFATE) 1 g tablet Take 1 tablet (1 g total) by mouth 4 (four) times daily. Patient not taking: Reported on 06/11/2019 11/13/18   [name], MD      Social History: She  reports that she has never smoked. She has never used smokeless tobacco. She reports that she does not drink alcohol or use drugs.  Family History: family history includes Leukemia in her brother; Seizures in her brother.   Review of Systems: Negative x 10 systems reviewed except as noted in the HPI.      Physical Exam:  Vital Signs: BP 122/65   Pulse 71   Temp 98.7 F (37.1 C) (Oral)   Resp 16   Ht 5\' 3"  (1.6 m)   Wt 76.2 kg   LMP 11/06/2018   BMI 29.76 kg/m  General:Hispanic female, breathing thru some contractions HEENT: normocephalic, atraumatic Heart: regular rate & rhythm.  No murmurs/rubs/gallops Lungs: clear to  auscultation bilaterally Abdomen: soft, gravid, non-tender;  EFW: 6#10oz Pelvic:   External: Normal external female genitalia  Cervix: was 4cm/ 90%/-1 to 0 on admission and an hour later was  Dilation: 7 / Effacement (%): 80, 90 / Station: -1, 0   Extremities: non-tender, symmetric, no edema bilaterally.  DTRs: +1   Neurologic: Alert & oriented x 3.    Pertinent Results:  Prenatal Labs: Blood type/Rh O positive  Antibody screen negative  Rubella Varicella Immune Immune  RPR Non reactive  HBsAg negative  HIV Non reactive  GC negative  Chlamydia negative  Genetic screening declined  1 hour GTT 137  3 hour GTT 92/203/180/149  GBS pending on 06/11/19   Baseline FHR: 130 with accelerations to 140s to 150, moderate variability Toco: contractions every 4 minutes apart  Bedside Ultrasound:  ROA/ posterior placenta CBG=67 Assessment:  Kristin Franklin is a 38 y.o. [phone] female at39wk6d in active labor GDM vs T2DM-unknown control ANA Insufficient prenatal care  Plan:  1. Admit to Labor & Delivery - anticipate vaginal delivery 2. CBC, T&S, Clrs, IVF, CMP 3. GBS unknown-GBS PCR done. Will not treat at this time because of no risk factors.   4. Consents obtained. 5. Bottle 6. CBG q2 hours  7. Contraception: undecided  [name]  06/11/2019 11:17 AM

## 2019-06-11 NOTE — OB Triage Note (Addendum)
Pt is a 38yo G7P6 at [redacted]w[redacted]d according to patient. Pt is a pt of [facility] but has not been seen since March. Pt states she has stayed at home. Pt c/o ctx that started around 0700 today with some brown discharge and urge to push. Pt denies an issues with this pregnancy or past pregnancies.

## 2019-06-11 NOTE — Discharge Instructions (Signed)
°Vaginal Delivery, Care After °Refer to this sheet in the next few weeks. These discharge instructions provide you with information on caring for yourself after delivery. Your caregiver may also give you specific instructions. Your treatment has been planned according to the most current medical practices available, but problems sometimes occur. Call your caregiver if you have any problems or questions after you go home. °HOME CARE INSTRUCTIONS °1. Take over-the-counter or prescription medicines only as directed by your caregiver or pharmacist. °2. Do not drink alcohol, especially if you are breastfeeding or taking medicine to relieve pain. °3. Do not smoke tobacco. °4. Continue to use good perineal care. Good perineal care includes: °1. Wiping your perineum from back to front °2. Keeping your perineum clean. °3. You can do sitz baths twice a day, to help keep this area clean °5. Do not use tampons, douche or have sex for 6 weeks °6. Shower only and avoid sitting in submerged water, aside from sitz baths °7. Wear a well-fitting bra that provides breast support. °8. Eat healthy foods. °9. Drink enough fluids to keep your urine clear or pale yellow. °10. Eat high-fiber foods such as whole grain cereals and breads, brown rice, beans, and fresh fruits and vegetables every day. These foods may help prevent or relieve constipation. °11. Avoid constipation with high fiber foods or medications, such as miralax or metamucil °12. Follow your caregiver's recommendations regarding resumption of activities such as climbing stairs, driving, lifting, exercising, or traveling. °13. Talk to your caregiver about resuming sexual activities. Resumption of sexual activities after 6 weeks is dependent upon your risk of infection, your rate of healing, and your comfort and desire to resume sexual activity. °14. Try to have someone help you with your household activities and your newborn for at least a few days after you leave the  hospital. °15. Rest as much as possible. Try to rest or take a nap when your newborn is sleeping. °16. Increase your activities gradually. °17. Keep all of your scheduled postpartum appointments. It is very important to keep your scheduled follow-up appointments. At these appointments, your caregiver will be checking to make sure that you are healing physically and emotionally. °SEEK MEDICAL CARE IF:  °· You are passing large clots from your vagina. Save any clots to show your caregiver. °· You have a foul smelling discharge from your vagina. °· You have trouble urinating. °· You are urinating frequently. °· You have pain when you urinate. °· You have a change in your bowel movements. °· You have increasing redness, pain, or swelling near your vaginal incision (episiotomy) or vaginal tear. °· You have pus draining from your episiotomy or vaginal tear. °· Your episiotomy or vaginal tear is separating. °· You have painful, hard, or reddened breasts. °· You have a severe headache. °· You have blurred vision or see spots. °· You feel sad or depressed. °· You have thoughts of hurting yourself or your newborn. °· You have questions about your care, the care of your newborn, or medicines. °· You are dizzy or light-headed. °· You have a rash. °· You have nausea or vomiting. °· You were breastfeeding and have not had a menstrual period within 12 weeks after you stopped breastfeeding. °· You are not breastfeeding and have not had a menstrual period by the 12th week after delivery. °· You have a fever of 100.5 or more °SEEK IMMEDIATE MEDICAL CARE IF:  °· You have persistent pain. °· You have chest pain. °· You have shortness   of breath. °· You faint. °· You have leg pain. °· You have stomach pain. °· Your vaginal bleeding saturates two or more sanitary pads in 1 hour. °MAKE SURE YOU:  °· Understand these instructions. °· Will watch your condition. °· Will get help right away if you are not doing well or get worse. °Document  Released: 11/01/2000 Document Revised: 03/21/2014 Document Reviewed: 07/01/2012 °Exit[employer]® Patient Information ©2015 [employer]. This information is not intended to replace advice given to you by your health care provider. Make sure you discuss any questions you have with your health care provider. ° °Sitz Bath °A sitz bath is a warm water bath taken in the sitting position. The water covers only the hips and butt (buttocks). We recommend using one that fits in the toilet, to help with ease of use and cleanliness. It may be used for either healing or cleaning purposes. Sitz baths are also used to relieve pain, itching, or muscle tightening (spasms). The water may contain medicine. Moist heat will help you heal and relax.  °HOME CARE  °Take 3 to 4 sitz baths a day. °18. Fill the bathtub half-full with warm water. °19. Sit in the water and open the drain a little. °20. Turn on the warm water to keep the tub half-full. Keep the water running constantly. °21. Soak in the water for 15 to 20 minutes. °22. After the sitz bath, pat the affected area dry. °GET HELP RIGHT AWAY IF: °You get worse instead of better. Stop the sitz baths if you get worse. °MAKE SURE YOU: °· Understand these instructions. °· Will watch your condition. °· Will get help right away if you are not doing well or get worse. °Document Released: 12/12/2004 Document Revised: 07/29/2012 Document Reviewed: 03/04/2011 °[employer]® Patient Information ©2015 [employer]. This information is not intended to replace advice given to you by your health care provider. Make sure you discuss any questions you have with your health care provider. ° ° °

## 2019-06-12 LAB — GLUCOSE, CAPILLARY
Glucose-Capillary: 69 mg/dL — ABNORMAL LOW (ref 70–99)
Glucose-Capillary: 115 mg/dL — ABNORMAL HIGH (ref 70–99)

## 2019-06-12 LAB — CBC
HCT: 27.2 % — ABNORMAL LOW (ref 36.0–46.0)
Hemoglobin: 7.9 g/dL — ABNORMAL LOW (ref 12.0–15.0)
MCH: 21.1 pg — ABNORMAL LOW (ref 26.0–34.0)
MCHC: 29 g/dL — ABNORMAL LOW (ref 30.0–36.0)
MCV: 72.5 fL — ABNORMAL LOW (ref 80.0–100.0)
Platelets: 228 10*3/uL (ref 150–400)
RBC: 3.75 MIL/uL — ABNORMAL LOW (ref 3.87–5.11)
RDW: 19 % — ABNORMAL HIGH (ref 11.5–15.5)
WBC: 13.5 10*3/uL — ABNORMAL HIGH (ref 4.0–10.5)
nRBC: 0.3 % — ABNORMAL HIGH (ref 0.0–0.2)

## 2019-06-12 LAB — RPR: RPR Ser Ql: NONREACTIVE

## 2019-06-12 MED ORDER — IBUPROFEN 600 MG PO TABS
600.0000 mg | ORAL_TABLET | Freq: Four times a day (QID) | ORAL | Status: DC
  Administered 2019-06-12: 600 mg via ORAL
  Filled 2019-06-12: qty 1

## 2019-06-12 NOTE — Progress Notes (Signed)
Discharge instructions given. Patient verbalizes understanding of teaching. Patient discharged home via wheelchair at 1530.

## 2019-06-12 NOTE — Progress Notes (Signed)
Admit Date: 06/11/2019 Today's Date: 06/12/2019  Post Partum Day 1  Subjective:  no complaints, up ad lib, voiding and tolerating PO  Objective: Temp:  [98.2 F (36.8 C)-98.9 F (37.2 C)] 98.2 F (36.8 C) (07/25 0744) Pulse Rate:  [54-71] 54 (07/25 0744) Resp:  [16-20] 18 (07/25 0744) BP: (92-122)/(47-65) 92/55 (07/25 0744) SpO2:  [96 %-100 %] 97 % (07/25 0744) Weight:  [76.2 kg] 76.2 kg (07/24 0957)  Physical Exam:  General: alert, cooperative and no distress Lochia: appropriate Uterine Fundus: firm Incision: none DVT Evaluation: No evidence of DVT seen on physical exam. Negative Homan's sign.  Recent Labs    06/11/19 1032 06/12/19 0630  HGB 8.6* 7.9*  HCT 29.6* 27.2*    Assessment/Plan: Discharge home, Contraception (desires Nexplanon), Bottle Feeding and Infant doing well  Iron for anemia, monitor for sx's of anemia Monitor for blood sugar concern as well; sx's and periodic testing   LOS: 1 day   Ackley 06/12/2019, 8:29 AM

## 2019-06-12 NOTE — Plan of Care (Signed)
Vs stable; now up ad lib; tolerating regular diet; has only had scheduled motrin for discomfort; formula feeding her baby

## 2019-06-14 ENCOUNTER — Telehealth: Payer: Self-pay | Admitting: Certified Nurse Midwife

## 2019-06-14 NOTE — Telephone Encounter (Signed)
Noted. Will order to arrive by apt date/time. 

## 2019-06-14 NOTE — Telephone Encounter (Signed)
Patient is schedule 07/12/19 with [facility] for Nexplanon placement

## 2019-06-15 IMAGING — US US OB COMP LESS 14 WK
1 series · 14 of 28 positions shown · non-contrast
Comparison: None.

CLINICAL DATA: Prenatal care for subsequent pregnancy. Unsure of
LMP.

EXAM:
OBSTETRIC <14 WK ULTRASOUND
TECHNIQUE: Transabdominal ultrasound was performed for evaluation of the
gestation as well as the maternal uterus and adnexal regions.

[Series 1: us ob comp less 14 wk · 14 of 61 slices shown]
[im 3/61]
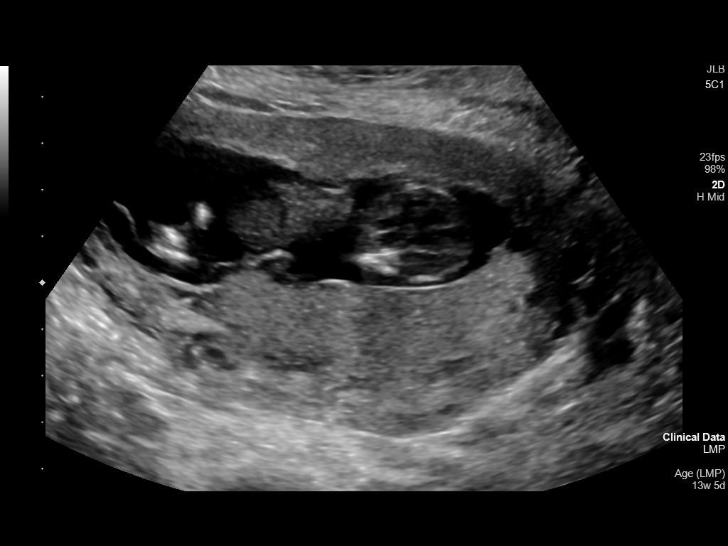
[im 7/61]
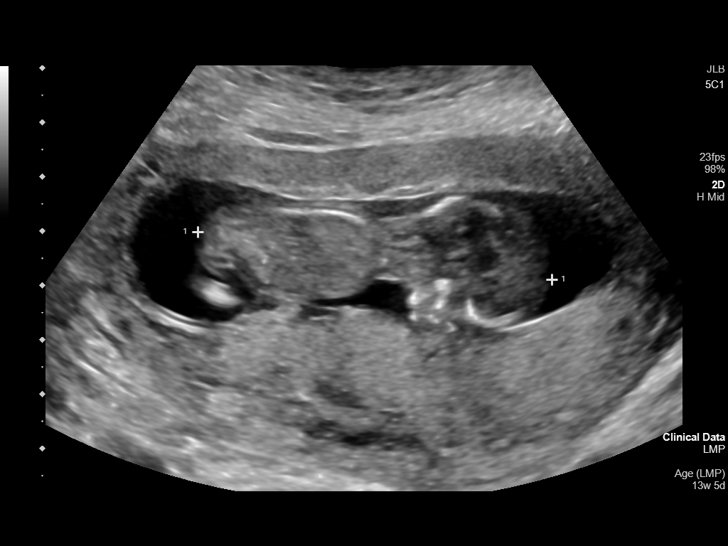
[im 12/61]
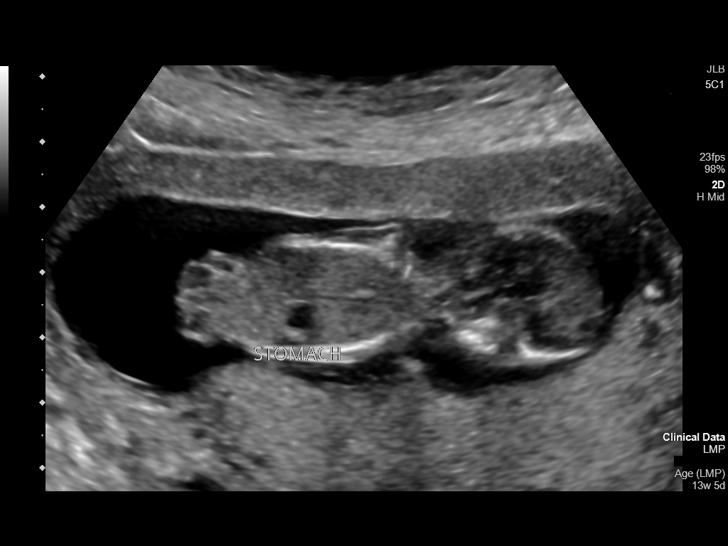
[im 16/61]
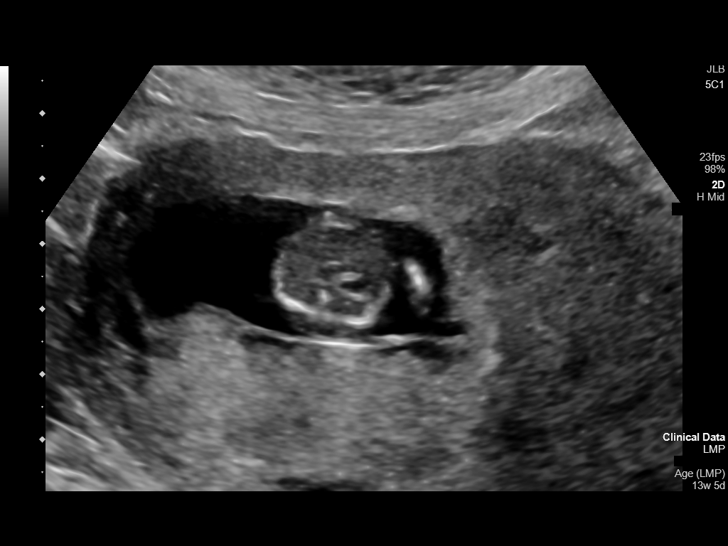
[im 21/61]
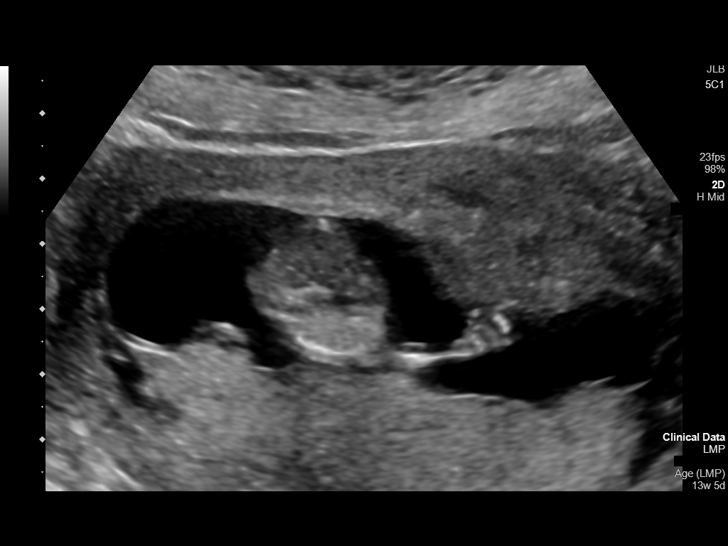
[im 25/61]
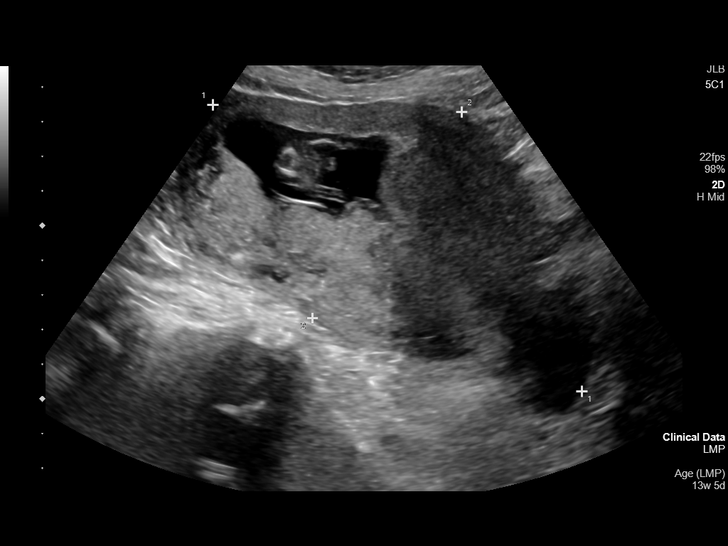
[im 29/61]
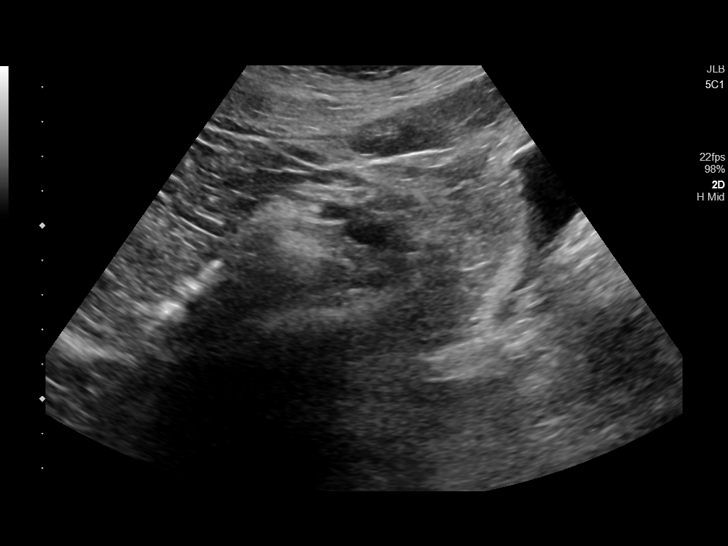
[im 34/61]
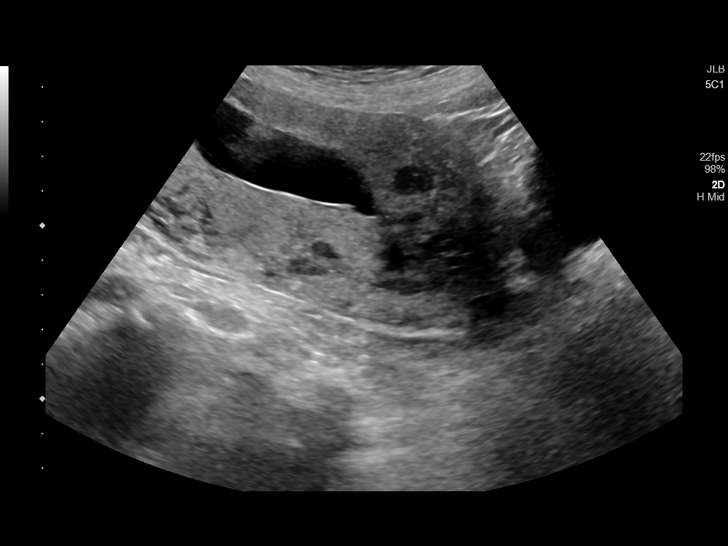
[im 38/61]
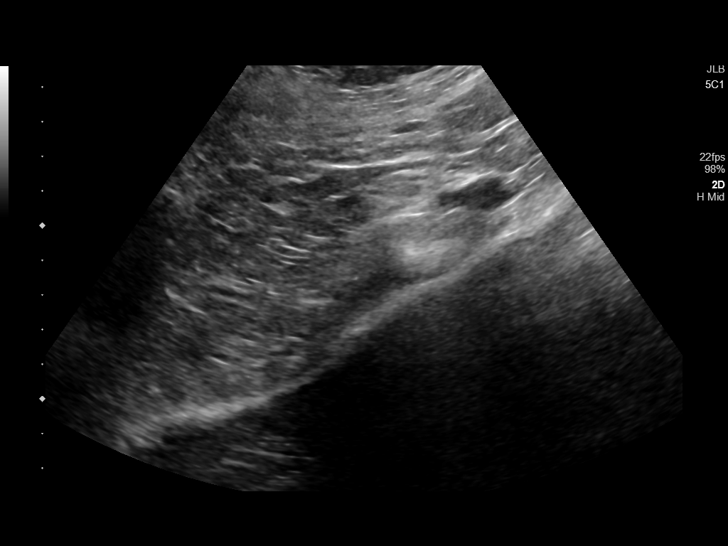
[im 43/61]
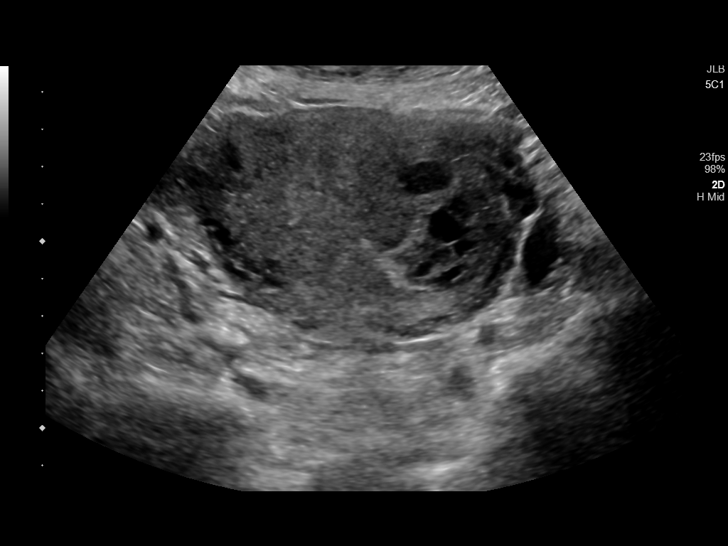
[im 47/61]
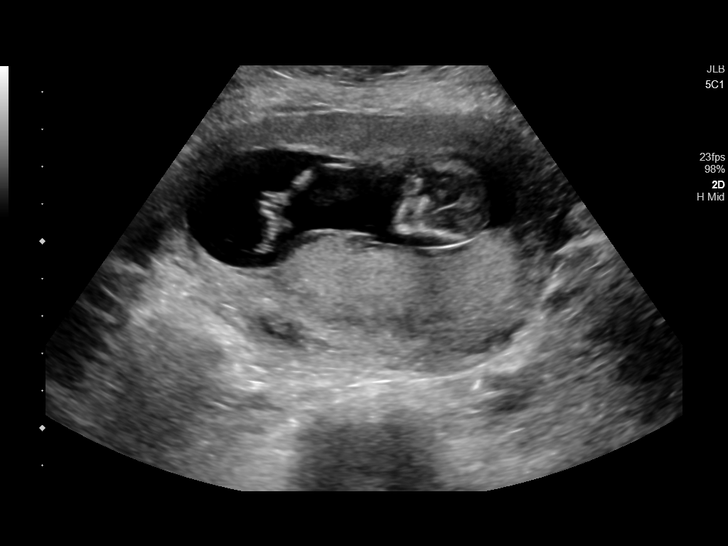
[im 52/61]
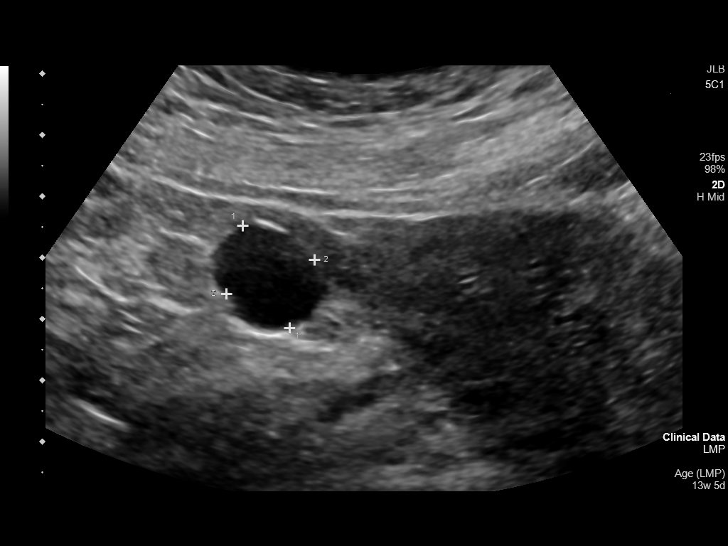
[im 56/61]
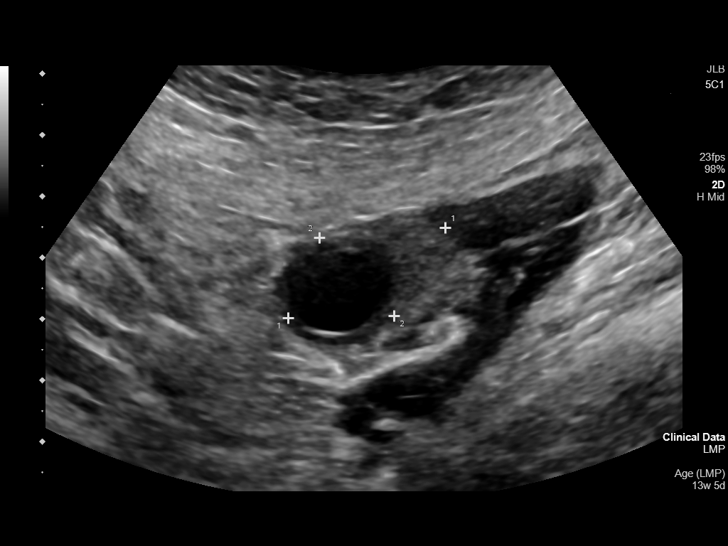
[im 61/61]
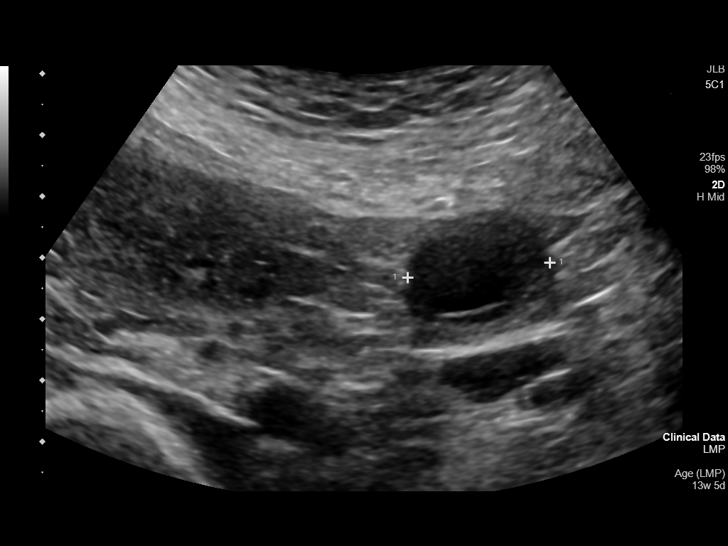

[14 of 28 positions shown; findings below may reference images not displayed]

FINDINGS: Intrauterine gestational sac: Single

Yolk sac:  Visualized.

Embryo:  Visualized.

Cardiac Activity: Visualized.

Heart Rate: 157 bpm

CRL:   65 mm   12 w 6 d                  US EDC: 06/12/2019

Subchorionic hemorrhage:  None visualized.

Maternal uterus/adnexae: A small intramural fibroid is seen left
anterior lower uterine segment measuring 1.2 cm. Both ovaries are
normal appearance. No adnexal mass or abnormal free fluid
identified.
IMPRESSION: Single living IUP measuring 12 weeks 6 days, with US EDC of
06/12/2019.

1.2 cm anterior uterine fibroid.

## 2019-07-12 ENCOUNTER — Ambulatory Visit: Payer: Self-pay | Admitting: Certified Nurse Midwife

## 2019-07-12 NOTE — Telephone Encounter (Signed)
Patient r/s to 9/1 with [facility].

## 2019-07-12 NOTE — Telephone Encounter (Signed)
Pt came to [facility] on accident pulled pt chart to help pt find location of apt. Thank you

## 2019-07-14 NOTE — Telephone Encounter (Signed)
Noted  

## 2019-07-19 NOTE — Progress Notes (Deleted)
Postpartum Visit  Chief Complaint: No chief complaint on file.   History of Present Illness: Kristin Franklin is a 38 y.o. [id number] presents for postpartum visit.  Date of delivery: *** Type of delivery: Vaginal delivery - Vacuum or forceps assisted  {yes/no:63} Episiotomy No.  Laceration: {yes/no:63}  Pregnancy or labor problems:  {yes/no:63} Any problems since the delivery:  {yes/no:63}  Newborn Details:  SINGLETON :  1. Baby's name: ***. Birth weight: *** Maternal Details:  Breast Feeding:  {yes/no:63} Post partum depression/anxiety noted:  {yes/no:63} Edinburgh Post-Partum Depression Score:  {numbers 2-72:53664}  Date of last PAP: ***  {norm/abn:16337}   Review of Systems: ROS  Past Medical History:  Past Medical History:  Diagnosis Date  . Advanced maternal age in multigravida   . Gestational diabetes    vs T2DM  . Coronita multiparity     Past Surgical History:  Past Surgical History:  Procedure Laterality Date  . CHOLECYSTECTOMY    . LEEP  2013    Family History:  Family History  Problem Relation Age of Onset  . Leukemia Brother   . Seizures Brother     Social History:  Social History   Socioeconomic History  . Marital status: Married    Spouse name: Cory Roughen   . Number of children: Not on file  . Years of education: Not on file  . Highest education level: Not on file  Occupational History  . Not on file  Social Needs  . Financial resource strain: Not hard at all  . Food insecurity    Worry: Never true    Inability: Never true  . Transportation needs    Medical: No    Non-medical: No  Tobacco Use  . Smoking status: Never Smoker  . Smokeless tobacco: Never Used  Substance and Sexual Activity  . Alcohol use: Never    Frequency: Never  . Drug use: Never  . Sexual activity: Yes    Birth control/protection: Implant  Lifestyle  . Physical activity    Days per week: 3 days    Minutes per session: 20 min  . Stress: Not at all   Relationships  . Social Herbalist on phone: Three times a week    Gets together: Three times a week    Attends religious service: 1 to 4 times per year    Active member of club or organization: No    Attends meetings of clubs or organizations: Never    Relationship status: Married  . Intimate partner violence    Fear of current or ex partner: No    Emotionally abused: No    Physically abused: No    Forced sexual activity: No  Other Topics Concern  . Not on file  Social History Narrative  . Not on file    Allergies:  No Known Allergies  Medications: Prior to Admission medications   Medication Sig Start Date End Date Taking? Authorizing Provider  Prenatal Vit-Fe Fumarate-FA (PRENATAL MULTIVITAMIN) TABS tablet Take 1 tablet by mouth daily at 12 noon.    [provider]    Physical Exam Vitals: There were no vitals filed for this visit.  General: NAD HEENT: normocephalic, anicteric Neck: No thyroid enlargement, no palpable nodules, no cervical lymphadenpathy Breast: Lactating, no inflammation, no masses, nipples intact Pulmonary: No increased work of breathing, CTAB Abdomen: Soft, non-tender, non-distended.  Umbilicus without lesions.  No hepatomegaly or masses palpable. No evidence of hernia. Genitourinary:  External: Well  healed perineum, no lesions or inflammation    Vagina: Normal vaginal mucosa, no evidence of prolapse.    Cervix: Grossly normal in appearance, no bleeding  Uterus: Well involuted, mobile, non-tender  Adnexa: No adnexal masses, non-tender  Rectal: deferred Extremities: no edema, erythema, or tenderness Neurologic: Grossly intact Psychiatric: mood appropriate, affect full  Assessment: 38 y.o. [id number] presenting for 6 week postpartum visit  Plan: ***  1) Contraception Education given regarding options for contraception, including {contraceptive options (MU measure 33):20677}. Patient would like to use *** for contraception.  2)   Pap *** - ASCCP guidelines and rational discussed.  Patient opts for *** screening interval.  3) Patient underwent screening for postpartum depression with *** concerns noted.  4) Discussed return to normal activity, recommend continuing prenatal vitamins.  5) Follow up 1 year for routine annual exam.

## 2019-07-20 ENCOUNTER — Ambulatory Visit: Payer: Self-pay | Admitting: Certified Nurse Midwife

## 2020-02-07 NOTE — Telephone Encounter (Signed)
NO SHOW
